# Patient Record
Sex: Female | Born: 1995 | Race: White | Hispanic: No | Marital: Married | Smoking: Never smoker
Health system: Southern US, Community
[De-identification: ages and names within clinical notes are randomized; demographics above are authoritative.]

## PROBLEM LIST (undated history)

## (undated) ENCOUNTER — Inpatient Hospital Stay: Admission: EM | Payer: Self-pay | Source: Home / Self Care

## (undated) DIAGNOSIS — J45909 Unspecified asthma, uncomplicated: Secondary | ICD-10-CM

## (undated) DIAGNOSIS — T7840XA Allergy, unspecified, initial encounter: Secondary | ICD-10-CM

## (undated) DIAGNOSIS — K219 Gastro-esophageal reflux disease without esophagitis: Secondary | ICD-10-CM

## (undated) DIAGNOSIS — G43909 Migraine, unspecified, not intractable, without status migrainosus: Secondary | ICD-10-CM

## (undated) DIAGNOSIS — F4310 Post-traumatic stress disorder, unspecified: Secondary | ICD-10-CM

## (undated) DIAGNOSIS — M24111 Other articular cartilage disorders, right shoulder: Secondary | ICD-10-CM

## (undated) DIAGNOSIS — R0981 Nasal congestion: Secondary | ICD-10-CM

## (undated) DIAGNOSIS — F419 Anxiety disorder, unspecified: Secondary | ICD-10-CM

## (undated) DIAGNOSIS — R51 Headache: Secondary | ICD-10-CM

## (undated) DIAGNOSIS — F319 Bipolar disorder, unspecified: Secondary | ICD-10-CM

## (undated) DIAGNOSIS — N809 Endometriosis, unspecified: Secondary | ICD-10-CM

## (undated) DIAGNOSIS — M329 Systemic lupus erythematosus, unspecified: Secondary | ICD-10-CM

## (undated) DIAGNOSIS — R519 Headache, unspecified: Secondary | ICD-10-CM

## (undated) DIAGNOSIS — M2520 Flail joint, unspecified joint: Secondary | ICD-10-CM

## (undated) HISTORY — DX: Anxiety disorder, unspecified: F41.9

## (undated) HISTORY — PX: TYMPANOSTOMY TUBE PLACEMENT: SHX32

## (undated) HISTORY — DX: Unspecified asthma, uncomplicated: J45.909

## (undated) HISTORY — DX: Post-traumatic stress disorder, unspecified: F43.10

## (undated) HISTORY — DX: Headache, unspecified: R51.9

## (undated) HISTORY — DX: Headache: R51

---
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
## 2016-09-02 DIAGNOSIS — M205X2 Other deformities of toe(s) (acquired), left foot: Secondary | ICD-10-CM | POA: Insufficient documentation

## 2016-11-19 HISTORY — PX: TENDON RELEASE: SHX230

## 2016-12-28 DIAGNOSIS — Z9889 Other specified postprocedural states: Secondary | ICD-10-CM | POA: Insufficient documentation

## 2017-02-25 ENCOUNTER — Encounter (HOSPITAL_COMMUNITY): Payer: Self-pay | Admitting: Emergency Medicine

## 2017-02-25 ENCOUNTER — Ambulatory Visit (HOSPITAL_COMMUNITY)
Admission: EM | Admit: 2017-02-25 | Discharge: 2017-02-25 | Disposition: A | Discharge status: Home / Self Care | Payer: BLUE CROSS/BLUE SHIELD | Attending: Emergency Medicine | Admitting: Emergency Medicine

## 2017-02-25 DIAGNOSIS — J069 Acute upper respiratory infection, unspecified: Secondary | ICD-10-CM | POA: Diagnosis not present

## 2017-02-25 DIAGNOSIS — J0140 Acute pansinusitis, unspecified: Secondary | ICD-10-CM | POA: Diagnosis not present

## 2017-02-25 DIAGNOSIS — N3001 Acute cystitis with hematuria: Secondary | ICD-10-CM | POA: Diagnosis not present

## 2017-02-25 LAB — POCT URINALYSIS DIP (DEVICE)
Bilirubin Urine: NEGATIVE
Glucose, UA: NEGATIVE mg/dL
KETONES UR: NEGATIVE mg/dL
Nitrite: POSITIVE — AB
PH: 7 (ref 5.0–8.0)
Protein, ur: NEGATIVE mg/dL
SPECIFIC GRAVITY, URINE: 1.015 (ref 1.005–1.030)
Urobilinogen, UA: 0.2 mg/dL (ref 0.0–1.0)

## 2017-02-25 LAB — POCT PREGNANCY, URINE: Preg Test, Ur: NEGATIVE

## 2017-02-25 MED ORDER — DOXYCYCLINE HYCLATE 100 MG PO CAPS: 100.0000 mg | ORAL_CAPSULE | Freq: Two times a day (BID) | ORAL | 0 refills | Status: AC

## 2017-02-25 MED ORDER — AEROCHAMBER PLUS MISC: 2 refills | Status: AC

## 2017-02-25 MED ORDER — NITROFURANTOIN MONOHYD MACRO 100 MG PO CAPS: 100.0000 mg | ORAL_CAPSULE | Freq: Two times a day (BID) | ORAL | 0 refills | Status: DC

## 2017-02-25 MED ORDER — HYDROCOD POLST-CPM POLST ER 10-8 MG/5ML PO SUER: 5.0000 mL | Freq: Two times a day (BID) | ORAL | 0 refills | Status: DC | PRN

## 2017-02-25 MED ORDER — FLUTICASONE PROPIONATE 50 MCG/ACT NA SUSP: 2.0000 | Freq: Every day | NASAL | 0 refills | Status: DC

## 2017-02-25 MED ORDER — ALBUTEROL SULFATE HFA 108 (90 BASE) MCG/ACT IN AERS: 2.0000 | INHALATION_SPRAY | RESPIRATORY_TRACT | 0 refills | Status: DC | PRN

## 2017-02-25 MED ORDER — IBUPROFEN 600 MG PO TABS: 600.0000 mg | ORAL_TABLET | Freq: Four times a day (QID) | ORAL | 0 refills | Status: DC | PRN

## 2017-02-25 MED ORDER — PHENAZOPYRIDINE HCL 200 MG PO TABS: 200.0000 mg | ORAL_TABLET | Freq: Three times a day (TID) | ORAL | 0 refills | Status: DC | PRN

## 2017-02-25 NOTE — ED Triage Notes (Signed)
Pt c/o cold sx onset: 6 days  Sx include: prod cough, fevers, facial pressure,   Also c/o UTI sx 6 days associated w/urinary urgency and dysuria   Taking: OTC cold meds w/temp relief.   A&O x4... NAD

## 2017-02-25 NOTE — ED Provider Notes (Signed)
HPI  SUBJECTIVE:  Mary Lyons is a 21 y.o. female who presents with two complaints: First she reports a URI x 1 week. She reports yellowish-green nasal congestion, rhinorrhea, postnasal drip, cough productive of the same materials or nasal congestion, sinus pain and pressure and right ear pain. She reports shortness of breath secondary to coughing. She has tried Sudafed and DayQuil with some improvement in her symptoms. Symptoms are worse with walking around. She reports fevers Tmax 101 for the first 2 days, but none since. She denies facial swelling, dental pain, change in hearing, otorrhea, allergy type symptoms. No wheezing, chest pain. No shortness of breath with exertion. No antibiotic in the past month, no antipyretic in past 6 or 8 hours.  Second, she reports urinary urgency, frequency, dysuria for the past week. She states that her back hurts. She denies cloudy or odorous urine, hematuria, abdominal pain, pelvic pain. No vaginal bleeding, discharge, general rash, vaginal odor. She is in a long-term monogamous relationship with a female who is asymptomatic. STDs are not a concern today. States that they were both were recently tested for all STDs including HIV which were negative. She tried Azo, Advil 400 mg daily and Tylenol as an milligrams once a twice a day with some improvement in her symptoms. There are no aggravating factors. She states this is identical to previous UTIs. She has a past medical history of asthma, pneumonia, UTI, pyelonephritis and nonobstructing nephrolithiasis. No history of gonorrhea, chlamydia, herpes, HIV, syphilis, Trichomonas, BV, yeast. This or diabetes, hypertension. LMP: Last week. Denies possibility pregnant. PMD: Dr. Floyde Parkins in [place].  Past Medical History:  Diagnosis Date  . Acid reflux   . Allergy   . Anxiety   . 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 Asthma    triggered by exercise and illness  . Bipolar 1  disorder (HCC)   . Loose joints   . Nasal congestion 11/29/2013  . PTSD (post-traumatic stress disorder)     Past Surgical History:  Procedure Laterality Date  . FEMUR IM NAIL Left 06/19/2015   Procedure: INTRAMEDULLARY (IM) RETROGRADE FEMORAL NAILING;  Surgeon: Sheral Apley, MD;  Location: [facility] OR;  Service: Orthopedics;  Laterality: Left;  . PERCUTANEOUS PINNING Left 06/19/2015   Procedure: PERCUTANEOUS PINNING second third and fourth metatarsals;  Surgeon: Sheral Apley, MD;  Location: [facility] OR;  Service: Orthopedics;  Laterality: Left;  . SHOULDER ARTHROSCOPY WITH BANKART REPAIR Right 12/06/2013   Procedure: RIGHT SHOULDER ATHROSCOPY WITH EXTENSIVE DEBRIDMENT, CAPSULORRHAPHY ANTERIOR WITH LABRAL REPAIR (BANKART);  Surgeon: Loreta Ave, MD;  Location:  SURGERY CENTER;  Service: Orthopedics;  Laterality: Right;  . TIBIA IM NAIL INSERTION Left 06/19/2015   Procedure: INTRAMEDULLARY (IM) NAIL TIBIAL;  Surgeon: Sheral Apley, MD;  Location: [facility] OR;  Service: Orthopedics;  Laterality: Left;  . TYMPANOSTOMY TUBE PLACEMENT     as a child    Family History  Problem Relation Age of Onset  . Heart disease Father   . Hypertension Father   . Heart disease Maternal Grandfather   . Heart disease Paternal Grandfather   . Diabetes Maternal Grandmother     Social History  Substance Use Topics  . Smoking status: Never Smoker  . Smokeless tobacco: Never Used  . Alcohol use Yes    No current facility-administered medications for this encounter.   Current Outpatient Prescriptions:  .  Ginger, Zingiber officinalis, (GINGER PO), Take 1 tablet by mouth daily., Disp: , Rfl:  .  OVER THE COUNTER MEDICATION, Take 1 tablet by mouth daily., Disp: , Rfl:  .  5-HTP-VALERIAN-NIACIN-B6-MG PO, Take by mouth., Disp: , Rfl:  .  albuterol (PROVENTIL HFA;VENTOLIN HFA) 108 (90 Base) MCG/ACT inhaler, Inhale 2 puffs into the lungs every 4 (four) hours as needed for wheezing., Disp: 1 Inhaler, Rfl:  0 .  ARIPiprazole (ABILIFY) 5 MG tablet, Take 1 tablet (5 mg total) by mouth daily. May increase to 2 tabs daily after 1 week (Patient not taking: Reported on 11/19/2014), Disp: 60 tablet, Rfl: 2 .  Ashwagandha Extract 2.5 % POWD, by Does not apply route., Disp: , Rfl:  .  ASHWAGANDHA PO, Take 1 tablet by mouth daily., Disp: , Rfl:  .  aspirin 325 MG tablet, Take 1 tablet (325 mg total) by mouth daily., Disp: 30 tablet, Rfl: 0 .  B Complex Vitamins (B COMPLEX 100 PO), Take 1 tablet by mouth daily., Disp: , Rfl:  .  chlorpheniramine-HYDROcodone (TUSSIONEX PENNKINETIC ER) 10-8 MG/5ML SUER, Take 5 mLs by mouth every 12 (twelve) hours as needed for cough., Disp: 120 mL, Rfl: 0 .  CRANBERRY CONCENTRATE PO, Take 2 tablets by mouth daily., Disp: , Rfl:  .  doxycycline (VIBRAMYCIN) 100 MG capsule, Take 1 capsule (100 mg total) by mouth 2 (two) times daily., Disp: 14 capsule, Rfl: 0 .  fluticasone (FLONASE) 50 MCG/ACT nasal spray, Place 2 sprays into both nostrils daily., Disp: 16 g, Rfl: 0 .  fluticasone (FLOVENT HFA) 110 MCG/ACT inhaler, Inhale 1 puff into the lungs 2 (two) times daily., Disp: , Rfl:  .  ibuprofen (ADVIL,MOTRIN) 600 MG tablet, Take 1 tablet (600 mg total) by mouth every 6 (six) hours as needed., Disp: 30 tablet, Rfl: 0 .  INOSITOL PO, Take 1 tablet by mouth 2 (two) times daily., Disp: , Rfl:  .  Melatonin 3 MG CAPS, Take by mouth., Disp: , Rfl:  .  mometasone-formoterol (DULERA) 100-5 MCG/ACT AERO, Inhale 2 puffs into the lungs 2 (two) times daily. , Disp: , Rfl:  .  montelukast (SINGULAIR) 10 MG tablet, Take 10 mg by mouth at bedtime., Disp: , Rfl:  .  nitrofurantoin, macrocrystal-monohydrate, (MACROBID) 100 MG capsule, Take 1 capsule (100 mg total) by mouth 2 (two) times daily. X 5 days, Disp: 10 capsule, Rfl: 0 .  Omega-3 Fatty Acids (OMEGA 3 PO), Take by mouth., Disp: , Rfl:  .  Omega-3 Fatty Acids (SUPER OMEGA-3 PO), Take 2 tablets by mouth daily., Disp: , Rfl:  .  phenazopyridine  (PYRIDIUM) 200 MG tablet, Take 1 tablet (200 mg total) by mouth 3 (three) times daily as needed for pain., Disp: 6 tablet, Rfl: 0 .  Spacer/Aero-Holding Chambers (AEROCHAMBER PLUS) inhaler, Use as instructed, Disp: 1 each, Rfl: 2  Allergies  Allergen Reactions  . Gluten Meal Other (See Comments)    Intolerance  . Morphine And Related Hives     ROS  As noted in HPI.   Physical Exam  BP 141/80 (BP Location: Left Arm)   Pulse 79   Temp 99.4 F (37.4 C) (Oral)   Resp 16   LMP 02/18/2017   SpO2 97%   Constitutional: Well developed, well nourished, no acute distress Eyes:  EOMI, conjunctiva normal bilaterally HENT: Normocephalic, atraumatic,mucus membranes moist. TMs normal bilaterally. Positive mucopurulent nasal congestion. Positive maxillary frontal sinus tenderness. Positive erythematous, swollen turbinates. Normal oropharynx. Positive postnasal drip with cobblestoning. Respiratory: Normal inspiratory effort, fair air movement, faint wheezing right lower lobe. No rales, rhonchi Cardiovascular: Normal rate regular  rhythm no murmurs rubs gallops  GI: nondistended soft, nontender, no suprapubic tenderness, active bowel sounds Back: No CVA tenderness skin: No rash, skin intact Musculoskeletal: no deformities Neurologic: Alert & oriented x 3, no focal neuro deficits Psychiatric: Speech and behavior appropriate   ED Course   Medications - No data to display  Orders Placed This Encounter  Procedures  . POCT urinalysis dip (device)    Standing Status:   Standing    Number of Occurrences:   1  . Pregnancy, urine POC    Standing Status:   Standing    Number of Occurrences:   1    Results for orders placed or performed during the hospital encounter of 02/25/17 (from the past 24 hour(s))  POCT urinalysis dip (device)     Status: Abnormal   Collection Time: 02/25/17  8:20 PM  Result Value Ref Range   Glucose, UA NEGATIVE NEGATIVE mg/dL   Bilirubin Urine NEGATIVE NEGATIVE    Ketones, ur NEGATIVE NEGATIVE mg/dL   Specific Gravity, Urine 1.015 1.005 - 1.030   Hgb urine dipstick TRACE (A) NEGATIVE   pH 7.0 5.0 - 8.0   Protein, ur NEGATIVE NEGATIVE mg/dL   Urobilinogen, UA 0.2 0.0 - 1.0 mg/dL   Nitrite POSITIVE (A) NEGATIVE   Leukocytes, UA SMALL (A) NEGATIVE  Pregnancy, urine POC     Status: None   Collection Time: 02/25/17  8:24 PM  Result Value Ref Range   Preg Test, Ur NEGATIVE NEGATIVE   No results found.  ED Clinical Impression  Upper respiratory tract infection, unspecified type  Acute pansinusitis, recurrence not specified  Acute cystitis with hematuria   ED Assessment/Plan  Patient with URI/sinusitis. We'll send home with Flonase, Mucinex D, wait and see prescription of doxycycline since it has not been 10 days total she has had this. We'll also sent home on albuterol with spacer given history of asthma and the wheezing in the right lower lobe. Offered to do chest x-ray, but patient is wanting to try this first. Doubt pneumonia. Doxycycline will cover pneumonia as well. Also Tussionex, ibuprofen 600 mg with 1 g Tylenol 3-4 times a day.   She also has a UTI. We'll send home with Macrobid and Pyridium.   Follow-up with PMD as needed. Discussed labs, medical decision-making, plan for follow-up with patient. She agrees with plan.   Meds ordered this encounter  Medications  . albuterol (PROVENTIL HFA;VENTOLIN HFA) 108 (90 Base) MCG/ACT inhaler    Sig: Inhale 2 puffs into the lungs every 4 (four) hours as needed for wheezing.    Dispense:  1 Inhaler    Refill:  0  . fluticasone (FLONASE) 50 MCG/ACT nasal spray    Sig: Place 2 sprays into both nostrils daily.    Dispense:  16 g    Refill:  0  . chlorpheniramine-HYDROcodone (TUSSIONEX PENNKINETIC ER) 10-8 MG/5ML SUER    Sig: Take 5 mLs by mouth every 12 (twelve) hours as needed for cough.    Dispense:  120 mL    Refill:  0  . Spacer/Aero-Holding Chambers (AEROCHAMBER PLUS) inhaler    Sig: Use  as instructed    Dispense:  1 each    Refill:  2  . ibuprofen (ADVIL,MOTRIN) 600 MG tablet    Sig: Take 1 tablet (600 mg total) by mouth every 6 (six) hours as needed.    Dispense:  30 tablet    Refill:  0  . doxycycline (VIBRAMYCIN) 100 MG capsule  Sig: Take 1 capsule (100 mg total) by mouth 2 (two) times daily.    Dispense:  14 capsule    Refill:  0  . phenazopyridine (PYRIDIUM) 200 MG tablet    Sig: Take 1 tablet (200 mg total) by mouth 3 (three) times daily as needed for pain.    Dispense:  6 tablet    Refill:  0  . nitrofurantoin, macrocrystal-monohydrate, (MACROBID) 100 MG capsule    Sig: Take 1 capsule (100 mg total) by mouth 2 (two) times daily. X 5 days    Dispense:  10 capsule    Refill:  0    *This clinic note was created using Scientist, clinical (histocompatibility and immunogenetics)Dragon dictation software. Therefore, there may be occasional mistakes despite careful proofreading.  ?   Domenick GongAshley Jo Booze, MD 02/25/17 2142

## 2017-02-25 NOTE — Discharge Instructions (Signed)
Take the medication as written. Start some Mucinex D in addition to the Flonase, saline nasal irrigation. 2 puffs from an albuterol inhaler with a spacer every 4-6 hours as needed for coughing and wheezing,  or for shortness of breath. You may take 600 mg of motrin with 1 gram of tylenol up to 3-4 times a day as needed for pain. This is an effective combination for pain.  Most sinus infections are viral and do not need antibiotics unless you have a high fever, have had this for 10 days, or you get better and then get sick again. Use a neti pot or the NeilMed sinus rinse as often as you want to to reduce nasal congestion. Follow the directions on the box.   Wait 3 more days to fill the doxycycline. This is an antibiotic for sinus infection. Start the SunGardMacrobid today or tomorrow. This is an antibiotic for urinary tract infection.  Go to www.goodrx.com to look up your medications. This will give you a list of where you can find your prescriptions at the most affordable prices.

## 2018-02-17 DIAGNOSIS — M329 Systemic lupus erythematosus, unspecified: Secondary | ICD-10-CM

## 2018-02-17 HISTORY — DX: Systemic lupus erythematosus, unspecified: M32.9

## 2018-06-10 ENCOUNTER — Ambulatory Visit (HOSPITAL_COMMUNITY)
Admission: EM | Admit: 2018-06-10 | Discharge: 2018-06-10 | Disposition: A | Discharge status: Home / Self Care | Payer: BLUE CROSS/BLUE SHIELD | Attending: Internal Medicine | Admitting: Internal Medicine

## 2018-06-10 ENCOUNTER — Encounter (HOSPITAL_COMMUNITY): Payer: Self-pay | Admitting: *Deleted

## 2018-06-10 ENCOUNTER — Other Ambulatory Visit: Payer: Self-pay

## 2018-06-10 DIAGNOSIS — B349 Viral infection, unspecified: Secondary | ICD-10-CM | POA: Diagnosis not present

## 2018-06-10 HISTORY — DX: Systemic lupus erythematosus, unspecified: M32.9

## 2018-06-10 MED ORDER — AMOXICILLIN-POT CLAVULANATE 875-125 MG PO TABS: 1.0000 | ORAL_TABLET | Freq: Two times a day (BID) | ORAL | 0 refills | Status: DC

## 2018-06-10 MED ORDER — IPRATROPIUM BROMIDE 0.06 % NA SOLN: 2.0000 | Freq: Four times a day (QID) | NASAL | 0 refills | Status: DC

## 2018-06-10 MED ORDER — BENZONATATE 100 MG PO CAPS: 100.0000 mg | ORAL_CAPSULE | Freq: Three times a day (TID) | ORAL | 0 refills | Status: DC

## 2018-06-10 MED ORDER — FLUTICASONE PROPIONATE 50 MCG/ACT NA SUSP: 2.0000 | Freq: Every day | NASAL | 0 refills | Status: DC

## 2018-06-10 NOTE — ED Triage Notes (Signed)
Pt states she has been having sinus pain and congestion since Monday. Pt also c/o productive cough (yellow/green) and sore throat.
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
## 2018-06-10 NOTE — Discharge Instructions (Signed)
Tessalon for cough. Start flonase, atrovent nasal spray, allergy medicine for nasal congestion/drainage. You can use over the counter nasal saline rinse such as neti pot for nasal congestion. Keep hydrated, your urine should be clear to pale yellow in color. Tylenol/motrin for fever and pain. Monitor for any worsening of symptoms, chest pain, shortness of breath, wheezing, swelling of the throat, follow up for reevaluation.   If symptoms not improving in 4 days, can fill augmentin for sinus infection.   For sore throat/cough try using a honey-based tea. Use 3 teaspoons of honey with juice squeezed from half lemon. Place shaved pieces of ginger into 1/2-1 cup of water and warm over stove top. Then mix the ingredients and repeat every 4 hours as needed.

## 2018-09-07 ENCOUNTER — Encounter: Payer: Self-pay | Admitting: Neurology

## 2018-09-11 ENCOUNTER — Ambulatory Visit: Payer: BLUE CROSS/BLUE SHIELD | Admitting: Neurology

## 2018-09-11 ENCOUNTER — Other Ambulatory Visit: Payer: Self-pay | Admitting: Neurology

## 2018-09-11 ENCOUNTER — Encounter: Payer: Self-pay | Admitting: Neurology

## 2018-09-11 VITALS — BP 125/86 | HR 86 | Ht 64.0 in | Wt 156.0 lb

## 2018-09-11 DIAGNOSIS — R768 Other specified abnormal immunological findings in serum: Secondary | ICD-10-CM | POA: Insufficient documentation

## 2018-09-11 DIAGNOSIS — M329 Systemic lupus erythematosus, unspecified: Secondary | ICD-10-CM | POA: Insufficient documentation

## 2018-09-11 DIAGNOSIS — G43109 Migraine with aura, not intractable, without status migrainosus: Secondary | ICD-10-CM | POA: Diagnosis not present

## 2018-09-11 DIAGNOSIS — M3219 Other organ or system involvement in systemic lupus erythematosus: Secondary | ICD-10-CM

## 2018-09-11 DIAGNOSIS — G43511 Persistent migraine aura without cerebral infarction, intractable, with status migrainosus: Secondary | ICD-10-CM | POA: Insufficient documentation

## 2018-09-11 MED ORDER — ERENUMAB-AOOE 70 MG/ML ~~LOC~~ SOAJ: 1.0000 "pen " | Freq: Once | SUBCUTANEOUS | 0 refills | Status: AC

## 2018-09-11 MED ORDER — AIMOVIG 140 MG/ML ~~LOC~~ SOAJ: 140.0000 mg | SUBCUTANEOUS | 5 refills | Status: AC

## 2018-09-11 NOTE — Progress Notes (Signed)
[facility]   Provider:  [name], M.D.  Primary Care Physician:    Referring Provider: Donnetta Hail, MD , Rheumatology    Chief Complaint  Patient presents with  . New Patient (Initial Visit)    pt alone, rm 10. pt states that she has had migraines since HS. She gets at least 1 a wk. [place] was [place] - May 2019 at the [facility] guest house, having Migraine daily. Since being back they are better but at least 1 weekly. Pt. was started on keppra because she was thought to have ABSENCE seizures when she was in [place].  Since returning she has not had these issues - Dr. Dierdre Forth weaned her off of the Keppra. She has Lupus- was diagnosed in March in [place]- [facility] .  Pt has tried topamax in past and she was unable to continue due to [place].    HPI:  KATENA PETITJEAN is a 22 y.o. female patient seen here on 09-11-2018 as in a referral from Dr. Dierdre Forth for management of migraine and possible seizures.   I have the pleasure of meeting KANOE WANNER today, a 22 year old Caucasian right-handed female with a newly diagnosed systemic lupus erythematosus.  She is a Consulting civil engineer at [employer] she presented for the first time with a rash in November 2018 but continued to develop more systemic symptoms by January.  At that time she began studying in [place] for one semester and stayed in the [place] of the [place] [employer] [place].  She was later diagnosed with lupus at [place] symptoms, facial rash, joint stiffness especially in the hands, reported to have had a positive antinuclear antibody and proteinuria also presented with migraines and possible episodes of absence- petit mal.  She continues to have fatigue by the time she presented at [facility] on 10 May 2018, had no certain of uveitis or deformity, lungs were clear regular heart rate, she continued on Plaquenil and prednisone but weaned off Keppra.  Urine analysis  confirmed that there was still a trace of proteinuria, and some crystals were present.  Her comprehensive metabolic profile showed an abnormal high A/ G ratio, BUN was 20 which means that she was partially dehydrated her creatinine was normal at 0.74, her glomerular filtration rate is normal.  There is no evidence of anemia, eosinophilia, positive failure, monocytosis.  She was reportedly having had a normal brain MRI and EEG in [place] and was placed on an antiseizure medication which Dr. Dierdre Forth offered to replace and refill but she desires to taper off.  She has in the meantime tapered off and has had no recurrence of symptoms.  Her migraines however have continued.  She wakes up with a bit of headaches, goes to bed with more. She has nausea and vomiting with migraine , photophobia and less phonophobia. Migraine affects her for 5-6 hours , right eye, she describes retro-orbital pressure, evolving into a band of tension that straddles the forehead on both sides actually goes around the head. She feels that dehydration may bring on more headaches, caffeine , too.   Chief complaint according to patient : still having migraine.   Sleep habits are as follows:  She eats between 6-7 Pm and goes to bed by midnight. Lately having insomnia. Once asleep she stays asleep.  She had very high bruising and dreamt very vividly while in [place] - whilst on high dose steroids.   Medical history and family history: maternal  cousin has MS, both brothers are healthy.    Social history: Consulting civil engineer, non smoker, non drinker- 2-3 a week. Single, band leader.     Review of Systems: Out of a complete 14 system review, the patient complains of only the following symptoms, and all other reviewed systems are negative.  Fatigue -" high " I am sleepy even with an adequate amount of sleep. Migraine .   Social History   Socioeconomic History  . Marital status: Single    Spouse name: Not on file  . Number of children: Not on  file  . Years of education: Not on file  . Highest education level: Not on file  Occupational History  . Not on file  Social Needs  . Financial resource strain: Not on file  . Food insecurity:    Worry: Not on file    Inability: Not on file  . Transportation needs:    Medical: Not on file    Non-medical: Not on file  Tobacco Use  . Smoking status: Never Smoker  . Smokeless tobacco: Never Used  Substance and Sexual Activity  . Alcohol use: Yes    Alcohol/week: 2.0 - 3.0 standard drinks    Types: 2 - 3 Standard drinks or equivalent per week    Frequency: Never  . Drug use: No  . Sexual activity: Never    Birth control/protection: IUD, Condom  Lifestyle  . Physical activity:    Days per week: Not on file    Minutes per session: Not on file  . Stress: Not on file  Relationships  . Social connections:    Talks on phone: Not on file    Gets together: Not on file    Attends religious service: Not on file    Active member of club or organization: Not on file    Attends meetings of clubs or organizations: Not on file    Relationship status: Not on file  . Intimate partner violence:    Fear of current or ex partner: Not on file    Emotionally abused: Not on file    Physically abused: Not on file    Forced sexual activity: Not on file  Other Topics Concern  . Not on file  Social History Narrative   ** Merged History Encounter **        Family History  Problem Relation Age of Onset  . Heart disease Father   . Hypertension Father   . Heart disease Maternal Grandfather   . Hypertension Maternal Grandfather   . Heart disease Paternal Grandfather   . Hypertension Paternal Grandfather   . Diabetes Maternal Grandmother   . Breast cancer Maternal Grandmother   . Skin cancer Paternal Grandmother   . Heart disease Maternal Aunt   . Hypertension Maternal Aunt   . Cervical cancer Paternal Aunt   . Hypertension Paternal Aunt   . Heart disease Paternal Aunt     Past Medical  History:  Diagnosis Date  . Acid reflux   . Allergy   . Anxiety   . 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 Asthma    triggered by exercise and illness  . Bipolar 1 disorder (HCC)   . Headache   . Loose joints   . Lupus (systemic lupus erythematosus) (HCC) 02/2018  . Nasal congestion 11/29/2013  . PTSD (post-traumatic stress disorder)     Past Surgical History:  Procedure Laterality Date  . FEMUR IM NAIL Left 06/19/2015   Procedure:  INTRAMEDULLARY (IM) RETROGRADE FEMORAL NAILING;  Surgeon: Sheral Apleyimothy D Murphy, MD;  [facility];  Service: Orthopedics;  Laterality: Left;  . PERCUTANEOUS PINNING Left 06/19/2015   Procedure: PERCUTANEOUS PINNING second third and fourth metatarsals;  Surgeon: Sheral Apleyimothy D Murphy, MD;  [facility];  Service: Orthopedics;  Laterality: Left;  . SHOULDER ARTHROSCOPY WITH BANKART REPAIR Right 12/06/2013   Procedure: RIGHT SHOULDER ATHROSCOPY WITH EXTENSIVE DEBRIDMENT, CAPSULORRHAPHY ANTERIOR WITH LABRAL REPAIR (BANKART);  Surgeon: Loreta Aveaniel F Murphy, MD;  Location: [facility];  Service: Orthopedics;  Laterality: Right;  . TENDON RELEASE Left 11/2016   left foot  . TIBIA IM NAIL INSERTION Left 06/19/2015   Procedure: INTRAMEDULLARY (IM) NAIL TIBIAL;  Surgeon: Sheral Apleyimothy D Murphy, MD;  [facility];  Service: Orthopedics;  Laterality: Left;  . TYMPANOSTOMY TUBE PLACEMENT     as a child    Current Outpatient Medications  Medication Sig Dispense Refill  . aspirin 325 MG tablet Take 1 tablet (325 mg total) by mouth daily. (Patient taking differently: Take 325 mg by mouth 3 (three) times daily. ) 30 tablet 0  . fluticasone (FLOVENT HFA) 110 MCG/ACT inhaler Inhale 1 puff into the lungs 2 (two) times daily.    . hydroxychloroquine (PLAQUENIL) 200 MG tablet Take 200 mg by mouth 2 (two) times daily.    . Multiple Vitamin (MULTIVITAMIN) tablet Take 1 tablet by mouth daily.    . Omega-3 Fatty Acids (SUPER OMEGA-3 PO) Take 2 tablets  by mouth daily.    Marland Kitchen. Spacer/Aero-Holding Chambers (AEROCHAMBER PLUS) inhaler Use as instructed 1 each 2   No current facility-administered medications for this visit.     Allergies as of 09/11/2018 - Review Complete 09/11/2018  Allergen Reaction Noted  . Gluten meal Other (See Comments) 06/19/2015  . Morphine and related Hives 02/25/2017  . Nsaids  06/10/2018    Vitals: BP 125/86   Pulse 86   Ht 5\' 4"  (1.626 m)   Wt 156 lb (70.8 kg)   BMI 26.78 kg/m  Last Weight:  Wt Readings from Last 1 Encounters:  09/11/18 156 lb (70.8 kg)   WGN:FAOZBMI:Body mass index is 26.78 kg/m.     Last Height:   Ht Readings from Last 1 Encounters:  09/11/18 5\' 4"  (1.626 m)    Physical exam:  General: The patient is awake, alert and appears not in acute distress. The patient is well groomed. Head: Normocephalic, atraumatic. Neck is supple. Mallampati 2,  neck circumference:14. Nasal airflow patent ,   Cardiovascular:  Regular rate and rhythm , without  murmurs or carotid bruit, and without distended neck veins. Respiratory: Lungs are clear to auscultation. Skin:  Without evidence of edema, or rash Trunk: BMI is 27. The patient's posture is erect.  Neurologic exam : The patient is awake and alert, oriented to place and time.    Attention span & concentration ability appears normal.  Speech is fluent,  without  dysarthria, dysphonia or aphasia.  Mood and affect are appropriate.  Cranial nerves: Pupils are equal and briskly reactive to light. Funduscopic exam without evidence of pallor or edema. Extraocular movements  in vertical and horizontal planes intact and without nystagmus. Visual fields by finger perimetry are intact. Hearing to finger rub intact.  Facial sensation intact to fine touch.  Facial motor strength is symmetric and tongue and uvula move midline. Shoulder shrug was symmetrical.   Motor exam:   Normal tone, muscle bulk and symmetric strength in all extremities. Sensory:  Fine touch,  pinprick and vibration were tested in all extremities. Proprioception tested in the upper extremities was normal. Coordination: Rapid alternating movements in the fingers/hands was normal. Finger-to-nose maneuver  normal without evidence of ataxia, dysmetria or tremor. Gait and station: Patient walks without assistive device, she has a mild drop foot on the left.   Deep tendon reflexes: in the  upper and lower extremities (patella) are symmetric and intact. Babinski maneuver response is downgoing on the rigt .   Assessment:  After physical and neurologic examination, review of laboratory studies,  Personal review of imaging studies, reports of other /same  Imaging studies, results of polysomnography and / or neurophysiology testing and pre-existing records as far as provided in visit., my assessment is   1) Mrs. Vanderwerf reports that she has begun suffering from migraines during high school but they were sporadic at the time 2 or 3 not every month.  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   2) She is no longer taking any antiseizure medicine and she has not had any spells but her migraines were not getting better while she took antiepileptic medication.  She weaned off Keppra she has had no absence spells. She was non responsive to verbal stimuli, to touch.   I will repeat a prolonged EEG ( not sleep deprived )  and review the outside MRI- she has a copy at home, has all her medical records at home-  She could not tolerate Topiramate, which was started in HS, while she was also depressed.  She cannot tolerate beta blockers, and is not a candidate for Valproic acid.   The patient was advised of the nature of the diagnosed disorder , the treatment options and the  risks for general health and wellness arising from not treating the condition.   I spent more than 60  minutes of face to face time with the patient.  Greater than 50% of time was spent in counseling and coordination of care. We have discussed the diagnosis and differential and I answered the patient's questions.    Plan:  Treatment plan and additional workup :  She has more than 8 migraines a month, and more than 15 headaches total.   I will order Aimovig for her.  EEG , 45 minutes with hyperventilation and with strobe light.   RV in 4-6 weeks.  [name], MD 09/11/2018, 2:20 PM  Certified in Neurology by ABPN Certified in Sleep Medicine by [facility] [address] [place], [place] [address]

## 2018-09-11 NOTE — Patient Instructions (Addendum)
Bring your MRI and records to your visit.   EEG repeat ordered. Will look together at your MRI.   Start Aimovig.

## 2018-09-13 ENCOUNTER — Other Ambulatory Visit: Payer: BLUE CROSS/BLUE SHIELD

## 2018-09-14 ENCOUNTER — Encounter: Payer: Self-pay | Admitting: Neurology

## 2018-09-20 ENCOUNTER — Encounter: Payer: Self-pay | Admitting: Neurology

## 2018-09-20 ENCOUNTER — Ambulatory Visit: Payer: BLUE CROSS/BLUE SHIELD | Admitting: Neurology

## 2018-09-20 DIAGNOSIS — M3219 Other organ or system involvement in systemic lupus erythematosus: Secondary | ICD-10-CM

## 2018-09-20 DIAGNOSIS — R2990 Unspecified symptoms and signs involving the nervous system: Secondary | ICD-10-CM | POA: Diagnosis not present

## 2018-09-20 DIAGNOSIS — G43109 Migraine with aura, not intractable, without status migrainosus: Secondary | ICD-10-CM

## 2018-09-20 DIAGNOSIS — R768 Other specified abnormal immunological findings in serum: Secondary | ICD-10-CM

## 2018-09-20 DIAGNOSIS — G43511 Persistent migraine aura without cerebral infarction, intractable, with status migrainosus: Secondary | ICD-10-CM

## 2018-09-21 ENCOUNTER — Telehealth: Payer: Self-pay

## 2018-09-21 NOTE — Telephone Encounter (Signed)
We received a prior authorization request for this medication. I have completed and submitted the PA on Cover My Meds and should have a determination within 48-72 hours.  Cover My Meds Key: [id number]    Medication has been APPROVED, Effective from 09/21/2018 through 12/19/2018.

## 2018-09-26 NOTE — Procedures (Signed)
EEG report for the patient Mary Lyons, performed on 20 September 2018.  This is a 60-minute recording with double banana referential montage.  The posterior dominant rhythm is established at 11 Hz, with photic stimulation there is minor entrainment, no epileptiform activity is noted.  Hyperventilation did lead to amplitude build up, with some slowing.  The slowest posterior dominant rhythm is noted at 8 Hz.  No epileptiform activity resulted, no phasic discharges occurred, isolated or repetitively.  Post hyperventilation there is bilaterally over P3 as well as Pinn for some phase reversal activity noted, this indicates beginning of drowsiness as it is associated with similar activity over the C zeta electrodes.  The patient remained conscious and aware of her surroundings throughout the recording, she became drowsy but did not enter sleep.  Conclusion this is a normal EEG for the patient's age and conscious state within the 30th minutes of recording the EKG electrode that lose, had to be reattached and I noted variable duration of the R to R interval.  Heart rate remains over 80 beats per minutes and is therefore rather fast.  Conclusion this is a normal EEG for the patient's age and conscious state.  The patient became drowsy but did not fall asleep.  [name], MD

## 2018-09-27 ENCOUNTER — Telehealth: Payer: Self-pay | Admitting: Neurology

## 2018-09-27 NOTE — Telephone Encounter (Signed)
-----   Message from [name], MD sent at 09/26/2018  4:38 PM EDT ----- Normal EEG , no epileptiform activity. Drowsiness and wakefulness recorded.

## 2018-09-27 NOTE — Telephone Encounter (Signed)
Called the pt and made her aware that Dr Vickey Huger reviewed her EEG. Informed her that EEG was normal and there was no evidence of seizure like activity. Pt verbalized understanding. Pt had no questions at this time but was encouraged to call back if questions arise.

## 2018-10-24 ENCOUNTER — Encounter: Payer: Self-pay | Admitting: Neurology

## 2018-10-24 ENCOUNTER — Ambulatory Visit: Payer: BLUE CROSS/BLUE SHIELD | Admitting: Neurology

## 2018-10-24 VITALS — BP 133/90 | HR 61 | Ht 65.0 in | Wt 160.0 lb

## 2018-10-24 DIAGNOSIS — R0683 Snoring: Secondary | ICD-10-CM | POA: Diagnosis not present

## 2018-10-24 DIAGNOSIS — G4709 Other insomnia: Secondary | ICD-10-CM | POA: Insufficient documentation

## 2018-10-24 DIAGNOSIS — G43711 Chronic migraine without aura, intractable, with status migrainosus: Secondary | ICD-10-CM | POA: Insufficient documentation

## 2018-10-24 DIAGNOSIS — R682 Dry mouth, unspecified: Secondary | ICD-10-CM

## 2018-10-24 MED ORDER — TRAZODONE HCL 50 MG PO TABS: 25.0000 mg | ORAL_TABLET | Freq: Every evening | ORAL | 5 refills | Status: DC | PRN

## 2018-10-24 NOTE — Patient Instructions (Signed)

## 2018-10-24 NOTE — Progress Notes (Signed)
SLEEP MEDICINE CLINIC   Provider:  [name], M.D.  Primary Care Physician:    Referring Provider: No ref. provider found , Rheumatology .       Chief Complaint  Patient presents with  . New Patient (Initial Visit)    pt alone, rm 10. pt states that she has had migraines since HS. She gets at least 1 a wk. She was in [place] over January - May 2019 at the [facility] guest house, having Migraine daily. Since being back they are better but at least 1 weekly. Pt. was started on keppra because she was thought to have ABSENCE seizures when she was in [place].  Since returning she has not had these issues - Dr. Dierdre Forth weaned her off of the Keppra. She has Lupus- was diagnosed in March in [place]- [facility] .  Pt has tried topamax in past and she was unable to continue due to [place].    HPI:  Mary Lyons , "Mary Lyons" is a 22 y.o. female patient seen today in a RV-on 24 October 2018.  Since last seen here in consultation on 11 September 2018 we had performed an EEG, on 26 September 2018 which was entirely normal.  The patient became drowsy but did not fall completely asleep.  There were no epileptiform discharges.  She still endorsed a high level of fatigue, she is not so much excessively daytime sleepy.  Her Epworth score was endorsed at 9 points of the fatigue severity score was endorsed at 53 out of 63 points.  She is well aware that any autoimmune disease usually has a strong fatigue component.  She has not any recurrence of possible absence seizures. She has not been sleeping well, and wanted to bring this to my attention.    CONSULTATION:  She was seen here on 09-11-2018 as in a referral from [place], [place],  for management of migraine and possible seizures.   I have the pleasure of meeting Mary Lyons today, a 22 year old Caucasian right-handed female with a newly diagnosed systemic lupus erythematosus.  She is a Consulting civil engineer at [employer] she presented  for the first time with a rash in November 2018 but continued to develop more systemic symptoms by January.  At that time she began studying in [place] for one semester and stayed in the [place] of the [place] [employer].  She was later diagnosed with lupus at [place] symptoms, facial rash, joint stiffness especially in the hands, reported to have had a positive antinuclear antibody and proteinuria also presented with migraines and possible episodes of absence- petit mal.  She continues to have fatigue by the time she presented at [facility] on 10 May 2018, had no certain of uveitis or deformity, lungs were clear regular heart rate, she continued on Plaquenil and prednisone but weaned off Keppra.  Urine analysis confirmed that there was still a trace of proteinuria, and some crystals were present.  Her comprehensive metabolic profile showed an abnormal high A/ G ratio, BUN was 20 which means that she was partially dehydrated her creatinine was normal at 0.74, her glomerular filtration rate is normal.  There is no evidence of anemia, eosinophilia, positive failure, monocytosis.  She was reportedly having had a normal brain MRI and EEG in [place] and was placed on an antiseizure medication which Dr. Dierdre Forth offered to replace and refill but she desires to taper off.  She has in the meantime tapered off and has had no recurrence of  symptoms.  Her migraines however have continued.  She wakes up with a bit of headaches, goes to bed with more. She has nausea and vomiting with migraine , photophobia and less phonophobia. Migraine affects her for 5-6 hours , right eye, she describes retro-orbital pressure, evolving into a band of tension that straddles the forehead on both sides actually goes around the head. She feels that dehydration may bring on more headaches, caffeine , too.   Chief complaint according to patient : still having migraine.   Sleep habits are as follows:  She eats between  6-7 Pm and goes to bed by midnight. Lately having insomnia. Once asleep she stays asleep.  She had very high bruising and dreamt very vividly while in [place] - whilst on high dose steroids.   Medical history and family history: maternal cousin has MS, both brothers are healthy.   Social history: Consulting civil engineer, non smoker, non drinker- 2-3 a week. Single, band leader.     Review of Systems: Out of a complete 14 system review, the patient complains of only the following symptoms, and all other reviewed systems are negative.  Always thirsty.   Fatigue -" high " I am sleepy even with an adequate amount of sleep. Migraine . Insomnia. No seizures.    Social History   Socioeconomic History  . Marital status: Single    Spouse name: Not on file  . Number of children: Not on file  . Years of education: Not on file  . Highest education level: Not on file  Occupational History  . Not on file  Social Needs  . Financial resource strain: Not on file  . Food insecurity:    Worry: Not on file    Inability: Not on file  . Transportation needs:    Medical: Not on file    Non-medical: Not on file  Tobacco Use  . Smoking status: Never Smoker  . Smokeless tobacco: Never Used  Substance and Sexual Activity  . Alcohol use: Yes    Alcohol/week: 2.0 - 3.0 standard drinks    Types: 2 - 3 Standard drinks or equivalent per week    Frequency: Never  . Drug use: No  . Sexual activity: Never    Birth control/protection: IUD, Condom  Lifestyle  . Physical activity:    Days per week: Not on file    Minutes per session: Not on file  . Stress: Not on file  Relationships  . Social connections:    Talks on phone: Not on file    Gets together: Not on file    Attends religious service: Not on file    Active member of club or organization: Not on file    Attends meetings of clubs or organizations: Not on file    Relationship status: Not on file  . Intimate partner violence:    Fear of current or ex  partner: Not on file    Emotionally abused: Not on file    Physically abused: Not on file    Forced sexual activity: Not on file  Other Topics Concern  . Not on file  Social History Narrative   ** Merged History Encounter **        Family History  Problem Relation Age of Onset  . Heart disease Father   . Hypertension Father   . Heart disease Maternal Grandfather   . Hypertension Maternal Grandfather   . Heart disease Paternal Grandfather   . Hypertension Paternal Grandfather   . Diabetes Maternal  Grandmother   . Breast cancer Maternal Grandmother   . Skin cancer Paternal Grandmother   . Heart disease Maternal Aunt   . Hypertension Maternal Aunt   . Cervical cancer Paternal Aunt   . Hypertension Paternal Aunt   . Heart disease Paternal Aunt     Past Medical History:  Diagnosis Date  . Acid reflux   . Allergy   . Anxiety   . 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 Asthma    triggered by exercise and illness  . Bipolar 1 disorder (HCC)   . Headache   . Loose joints   . Lupus (systemic lupus erythematosus) (HCC) 02/2018  . Nasal congestion 11/29/2013  . PTSD (post-traumatic stress disorder)     Past Surgical History:  Procedure Laterality Date  . FEMUR IM NAIL Left 06/19/2015   Procedure: INTRAMEDULLARY (IM) RETROGRADE FEMORAL NAILING;  Surgeon: Sheral Apley, MD;  Location: [facility];  Service: Orthopedics;  Laterality: Left;  . PERCUTANEOUS PINNING Left 06/19/2015   Procedure: PERCUTANEOUS PINNING second third and fourth metatarsals;  Surgeon: Sheral Apley, MD;  Location: [facility];  Service: Orthopedics;  Laterality: Left;  . SHOULDER ARTHROSCOPY WITH BANKART REPAIR Right 12/06/2013   Procedure: RIGHT SHOULDER ATHROSCOPY WITH EXTENSIVE DEBRIDMENT, CAPSULORRHAPHY ANTERIOR WITH LABRAL REPAIR (BANKART);  Surgeon: Loreta Ave, MD;  Location: [facility];  Service: Orthopedics;  Laterality: Right;  . TENDON RELEASE Left 11/2016    left foot  . TIBIA IM NAIL INSERTION Left 06/19/2015   Procedure: INTRAMEDULLARY (IM) NAIL TIBIAL;  Surgeon: Sheral Apley, MD;  Location: [facility];  Service: Orthopedics;  Laterality: Left;  . TYMPANOSTOMY TUBE PLACEMENT     as a child    Current Outpatient Medications  Medication Sig Dispense Refill  . AIMOVIG 140 MG/ML SOAJ Inject 140 mg into the skin every 30 (thirty) days. 1 pen 5  . aspirin 325 MG tablet Take 1 tablet (325 mg total) by mouth daily. (Patient taking differently: Take 325 mg by mouth 3 (three) times daily. ) 30 tablet 0  . fluticasone (FLOVENT HFA) 110 MCG/ACT inhaler Inhale 1 puff into the lungs 2 (two) times daily.    . hydroxychloroquine (PLAQUENIL) 200 MG tablet Take 200 mg by mouth 2 (two) times daily.    . Multiple Vitamin (MULTIVITAMIN) tablet Take 1 tablet by mouth daily.    . Omega-3 Fatty Acids (SUPER OMEGA-3 PO) Take 2 tablets by mouth daily.    Marland Kitchen Spacer/Aero-Holding Chambers (AEROCHAMBER PLUS) inhaler Use as instructed 1 each 2  . Erenumab-aooe (AIMOVIG) 70 MG/ML SOAJ Inject 1 pen into the skin once for 1 dose. (Patient taking differently: Inject 1 pen into the skin once. Pt received a sample in the left upper thigh. Educated the patient on how to give the medication. LOT [id number] exp12/20 [id number]) 1 pen 0   No current facility-administered medications for this visit.     Allergies as of 10/24/2018 - Review Complete 10/24/2018  Allergen Reaction Noted  . Gluten meal Other (See Comments) 06/19/2015  . Morphine and related Hives 02/25/2017  . Nsaids  06/10/2018    Vitals: BP 133/90   Pulse 61   Ht 5\' 5"  (1.651 m)   Wt 160 lb (72.6 kg)   BMI 26.63 kg/m  Last Weight:  Wt Readings from Last 1 Encounters:  10/24/18 160 lb (72.6 kg)   VOZ:DGUY mass index is 26.63 kg/m.     Last Height:   Ht Readings from  Last 1 Encounters:  10/24/18 5\' 5"  (1.651 m)    Physical exam:  General: The patient is awake, alert and appears not in acute distress.  The patient is well groomed. She has had insomnia, but has not felt depressed or manic.  Head: Normocephalic, atraumatic.Neck is supple. Mallampati 2, neck circumference:14. Nasal airflow patent ,   Trunk: BMI is 27. The patient's posture is erect.  Neurologic exam :The patient is awake and alert, oriented to place and time.   Attention span & concentration ability appears normal. Speech is fluent,  without  dysarthria, dysphonia or aphasia.  Mood and affect are depressed, she seems under stress.   Cranial nerves: Pupils are equal and briskly reactive to light. Funduscopic exam without evidence of pallor or edema. Extraocular movements  in vertical and horizontal planes intact and without nystagmus.  Visual fields by finger perimetry are intact. Hearing to finger rub intact.Facial sensation intact to fine touch.Facial motor strength is symmetric and tongue and uvula move midline. Shoulder shrug was symmetrical.   Motor exam: Normal tone, muscle bulk and symmetric strength in all extremities. Sensory:  Fine touch, pinprick and vibration were tested in all extremities. Proprioception tested in the upper extremities was normal.Coordination: Rapid alternating movements in the fingers/hands was normal. Finger-to-nose maneuver  normal without evidence of ataxia, dysmetria or tremor. Gait and station: Patient walks with a mild drop foot on the left.   Deep tendon reflexes: in the upper and lower extremities (patella) are symmetric and intact. Babinski maneuver response is downgoing on the right.   Assessment:  After physical and neurologic examination, review of laboratory studies,  Personal review of imaging studies, reports of other /same imaging studies, results of polysomnography and / or neurophysiology testing and pre-existing records as far as provided from [place] , [place] in [place].  My assessment is:  I see no evidence of abnormal brain function, EEG and MRI were normal. No need at this time   for seizure medication .    1) Mary Lyons reports that she has begun suffering from migraines during high school but they were sporadic at the time 2 or 3 not every month.  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   2) Insomnia. Has reduced caffeine intake, gets only 4 hours of sleep, this has culminated in sleep deprivation. The patient has trouble to stay asleep.  She may snore , has now a dry mouth and is always thirsty.      The patient was advised of the nature of the diagnosed disorder , the treatment options and the  risks for general health and wellness arising from not treating the condition.   I spent more than 20 minutes of face to face time with the patient.  Greater than 50% of time was spent in counseling and coordination of care. We have discussed the diagnosis and differential and I answered the patient's questions.    Plan:  Treatment plan and additional workup :  She has more than 8 migraines a month, and more than 15 headaches total. Continue Aimovig for her.  I will screen her for sleep apnea, with full EEG.  Insomnia- start trazodone, 25 mg in PM.    [name], MD 10/24/2018, 3:24 PM  Certified in Neurology by ABPN Certified in Sleep Medicine by [facility] [address]  [place], [place] [address]

## 2018-10-25 ENCOUNTER — Telehealth: Payer: Self-pay

## 2018-10-25 ENCOUNTER — Other Ambulatory Visit: Payer: Self-pay | Admitting: Neurology

## 2018-10-25 DIAGNOSIS — R0683 Snoring: Secondary | ICD-10-CM

## 2018-10-25 DIAGNOSIS — R682 Dry mouth, unspecified: Secondary | ICD-10-CM

## 2018-10-25 DIAGNOSIS — G43711 Chronic migraine without aura, intractable, with status migrainosus: Secondary | ICD-10-CM

## 2018-10-25 DIAGNOSIS — G4709 Other insomnia: Secondary | ICD-10-CM

## 2018-10-25 NOTE — Telephone Encounter (Signed)
Order placed for the patient.

## 2018-10-25 NOTE — Telephone Encounter (Signed)
BCBS denied in lab sleep study, need HST order 

## 2018-12-07 ENCOUNTER — Telehealth: Payer: Self-pay | Admitting: Neurology

## 2018-12-07 NOTE — Telephone Encounter (Signed)
PA submitted through cover my meds. [id number] Will hear response in 3 days

## 2018-12-11 NOTE — Telephone Encounter (Signed)
PA approved through [employer] [place] for Aimovig. Covered 12/07/2018-12/06/2019 REF # [id number]

## 2019-03-03 ENCOUNTER — Ambulatory Visit (HOSPITAL_COMMUNITY)
Admission: EM | Admit: 2019-03-03 | Discharge: 2019-03-03 | Disposition: A | Discharge status: Home / Self Care | Payer: BLUE CROSS/BLUE SHIELD | Attending: Internal Medicine | Admitting: Internal Medicine

## 2019-03-03 ENCOUNTER — Other Ambulatory Visit: Payer: Self-pay

## 2019-03-03 ENCOUNTER — Encounter (HOSPITAL_COMMUNITY): Payer: Self-pay | Admitting: Emergency Medicine

## 2019-03-03 DIAGNOSIS — R05 Cough: Secondary | ICD-10-CM

## 2019-03-03 DIAGNOSIS — B349 Viral infection, unspecified: Secondary | ICD-10-CM

## 2019-03-03 DIAGNOSIS — J029 Acute pharyngitis, unspecified: Secondary | ICD-10-CM

## 2019-03-03 LAB — RESPIRATORY PANEL BY PCR
ADENOVIRUS-RVPPCR: NOT DETECTED
Bordetella pertussis: NOT DETECTED
CORONAVIRUS HKU1-RVPPCR: NOT DETECTED
CORONAVIRUS NL63-RVPPCR: NOT DETECTED
CORONAVIRUS OC43-RVPPCR: NOT DETECTED
Chlamydophila pneumoniae: NOT DETECTED
Coronavirus 229E: NOT DETECTED
INFLUENZA A-RVPPCR: NOT DETECTED
Influenza B: NOT DETECTED
METAPNEUMOVIRUS-RVPPCR: NOT DETECTED
Mycoplasma pneumoniae: NOT DETECTED
PARAINFLUENZA VIRUS 1-RVPPCR: NOT DETECTED
PARAINFLUENZA VIRUS 2-RVPPCR: NOT DETECTED
PARAINFLUENZA VIRUS 3-RVPPCR: NOT DETECTED
PARAINFLUENZA VIRUS 4-RVPPCR: NOT DETECTED
RHINOVIRUS / ENTEROVIRUS - RVPPCR: NOT DETECTED
Respiratory Syncytial Virus: NOT DETECTED
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
## 2019-03-03 NOTE — ED Triage Notes (Signed)
Sore throat on wed, 3/11. Chest pain started then too, pain in right chest Has a cough Chills on Thursday 3/12, denies fever  Arrived home from [facility] on Monday night 3/9.  Denies symptoms at that time

## 2019-03-06 ENCOUNTER — Telehealth (HOSPITAL_COMMUNITY): Payer: Self-pay | Admitting: Emergency Medicine

## 2019-03-06 NOTE — Telephone Encounter (Signed)
Pt called and given results after reviewed with SN; pt verbalized understanding

## 2019-03-06 NOTE — Telephone Encounter (Signed)
Called patient to inform her COVID-19 is still in process. Informed her respiratory panel is negative.

## 2019-03-12 LAB — NOVEL CORONAVIRUS, NAA (HOSPITAL ORDER, SEND-OUT TO REF LAB): SARS-COV-2, NAA: NOT DETECTED

## 2019-04-05 ENCOUNTER — Telehealth: Payer: Self-pay | Admitting: Neurology

## 2019-04-05 NOTE — Telephone Encounter (Signed)
Pt states that her therapist recommends that her traZODone (DESYREL) 50 MG tablet is changed to a higher dosage. Please advise.

## 2019-04-08 MED ORDER — TRAZODONE HCL 50 MG PO TABS: 75.0000 mg | ORAL_TABLET | Freq: Every evening | ORAL | 5 refills | Status: DC | PRN

## 2019-04-08 NOTE — Telephone Encounter (Signed)
Yes, I would consider a higher dose. 100 mg trazodone. CD

## 2019-04-09 ENCOUNTER — Encounter: Payer: Self-pay | Admitting: *Deleted

## 2019-04-09 ENCOUNTER — Encounter: Payer: Self-pay | Admitting: Neurology

## 2019-04-11 ENCOUNTER — Other Ambulatory Visit: Payer: Self-pay | Admitting: Neurology

## 2019-04-11 MED ORDER — TRAZODONE HCL 100 MG PO TABS: 100.0000 mg | ORAL_TABLET | Freq: Every evening | ORAL | 5 refills | Status: AC | PRN

## 2019-07-31 ENCOUNTER — Telehealth: Payer: Self-pay | Admitting: Neurology

## 2019-07-31 NOTE — Telephone Encounter (Signed)
Pa completed for the patient through BCBS/cover my meds. KEY: [id number]. Will wait to hear a response

## 2019-08-02 NOTE — Telephone Encounter (Signed)
Received approval notice [employer]BS. I faxed a copy to the pt's pharmacy. Received a receipt of confirmation.

## 2019-08-02 NOTE — Telephone Encounter (Signed)
Approval for Aimovig received through covermymeds.  Effective from 07/31/2019 through 07/29/2020.

## 2019-11-28 ENCOUNTER — Telehealth: Payer: Self-pay | Admitting: Neurology

## 2019-11-28 NOTE — Telephone Encounter (Signed)
PA completed and submitted through cover my meds/ BCBS. [id number] Will take up to 3 days before hearing response.

## 2020-01-03 NOTE — Telephone Encounter (Signed)
PA was approved from 07/31/2019 through 07/29/2020 through [facility]

## 2021-09-09 ENCOUNTER — Other Ambulatory Visit: Payer: Self-pay | Admitting: Neurology

## 2024-10-30 ENCOUNTER — Ambulatory Visit: Admission: EM | Admit: 2024-10-30 | Discharge: 2024-10-30 | Disposition: A | Discharge status: Home / Self Care

## 2024-10-30 ENCOUNTER — Telehealth: Payer: Self-pay | Admitting: Internal Medicine

## 2024-10-30 DIAGNOSIS — Z79899 Other long term (current) drug therapy: Secondary | ICD-10-CM | POA: Diagnosis not present

## 2024-10-30 DIAGNOSIS — R21 Rash and other nonspecific skin eruption: Secondary | ICD-10-CM | POA: Insufficient documentation

## 2024-10-30 DIAGNOSIS — B09 Unspecified viral infection characterized by skin and mucous membrane lesions: Secondary | ICD-10-CM | POA: Diagnosis present

## 2024-10-30 DIAGNOSIS — J45909 Unspecified asthma, uncomplicated: Secondary | ICD-10-CM | POA: Insufficient documentation

## 2024-10-30 DIAGNOSIS — J029 Acute pharyngitis, unspecified: Secondary | ICD-10-CM | POA: Insufficient documentation

## 2024-10-30 DIAGNOSIS — L299 Pruritus, unspecified: Secondary | ICD-10-CM | POA: Diagnosis not present

## 2024-10-30 DIAGNOSIS — Z7982 Long term (current) use of aspirin: Secondary | ICD-10-CM | POA: Insufficient documentation

## 2024-10-30 DIAGNOSIS — R051 Acute cough: Secondary | ICD-10-CM | POA: Diagnosis present

## 2024-10-30 DIAGNOSIS — Z7951 Long term (current) use of inhaled steroids: Secondary | ICD-10-CM | POA: Insufficient documentation

## 2024-10-30 HISTORY — DX: Migraine, unspecified, not intractable, without status migrainosus: G43.909

## 2024-10-30 HISTORY — DX: Endometriosis, unspecified: N80.9

## 2024-10-30 LAB — POC COVID19/FLU A&B COMBO
Covid Antigen, POC: NEGATIVE
Influenza A Antigen, POC: NEGATIVE
Influenza B Antigen, POC: NEGATIVE

## 2024-10-30 LAB — POCT RAPID STREP A (OFFICE): Rapid Strep A Screen: NEGATIVE

## 2024-10-30 MED ORDER — BENZONATATE 100 MG PO CAPS: 100.0000 mg | ORAL_CAPSULE | Freq: Three times a day (TID) | ORAL | 0 refills | Status: AC | PRN

## 2024-10-30 NOTE — Discharge Instructions (Signed)
 Covid, flu, and strep were all negative. Throat culture is pending. We will call if it is abnormal. All of your symptoms appear to be related to a viral illness and should run its course. I have prescribed cough medication to take as needed. Ensure you are drinking plenty of water  as well. Follow up with your Primary care doctor if symptoms persist or worsen.

## 2024-10-30 NOTE — ED Triage Notes (Signed)
 Pt c/o productive cough, chest congestion, headache,fever, rash on chest and back x 3 days.Taking dayquil and nyquil with minimal relief

## 2024-10-30 NOTE — Telephone Encounter (Signed)
 Patient came back into the urgent stating that she was allergic to the benzonatate  that was prescribed. Although, she did not provide this information during review of allergies. Review of her chart does not note that she is allergic to benzonatate . Called pharmacy and discontinued benzonatate . Pharmacy also confirmed that they did not have benzonatate  listed as an allergy. Given the daily medications that patient takes, she is limited on cough medications that can be prescribed so that medication reaction will not occur. Recommended guaifenesin OTC to take as needed. Also advised PCP follow up.
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
## 2024-11-02 ENCOUNTER — Ambulatory Visit (HOSPITAL_COMMUNITY): Payer: Self-pay

## 2024-11-02 LAB — CULTURE, GROUP A STREP (THRC)
